# Patient Record
Sex: Female | Born: 1970 | Race: Black or African American | Hispanic: No | Marital: Single
Health system: Southern US, Community
[De-identification: ages and names within clinical notes are randomized; demographics above are authoritative.]

---

## 2003-04-21 ENCOUNTER — Emergency Department (HOSPITAL_COMMUNITY): Admission: AD | Admit: 2003-04-21 | Discharge: 2003-04-21 | Payer: Self-pay | Admitting: Family Medicine

## 2004-04-25 ENCOUNTER — Emergency Department (HOSPITAL_COMMUNITY): Admission: EM | Admit: 2004-04-25 | Discharge: 2004-04-25 | Payer: Self-pay | Admitting: Emergency Medicine

## 2004-05-26 ENCOUNTER — Emergency Department (HOSPITAL_COMMUNITY): Admission: EM | Admit: 2004-05-26 | Discharge: 2004-05-26 | Payer: Self-pay | Admitting: Emergency Medicine

## 2004-08-04 ENCOUNTER — Emergency Department (HOSPITAL_COMMUNITY): Admission: EM | Admit: 2004-08-04 | Discharge: 2004-08-04 | Payer: Self-pay | Admitting: Emergency Medicine

## 2004-08-04 ENCOUNTER — Emergency Department (HOSPITAL_COMMUNITY): Admission: EM | Admit: 2004-08-04 | Discharge: 2004-08-04 | Payer: Self-pay | Admitting: Family Medicine

## 2005-07-05 ENCOUNTER — Emergency Department (HOSPITAL_COMMUNITY): Admission: EM | Admit: 2005-07-05 | Discharge: 2005-07-05 | Payer: Self-pay | Admitting: Emergency Medicine

## 2006-08-08 IMAGING — CT CT ABDOMEN W/O CM
1 series · 15 of 32 positions shown, 19 images · non-contrast
Comparison: None.

CLINICAL DATA: Left flank pain and left lower quadrant abdominal pain since
earlier this morning.

CT ABDOMEN AND PELVIS WITHOUT CONTRAST (URINARY CALCULUS PROTOCOL)  05/26/2004:
TECHNIQUE: Neither intravenous nor oral contrast were administered.
Multidetector helical CT through the abdomen and pelvis was performed to include
the kidneys, ureters, and bladder.

[Series 2: renal stone · axial · 0.79mm/px · z∈[-400,-56]mm · 15 of 76 slices shown, 19 images]
[im 5/76  soft-tissue]
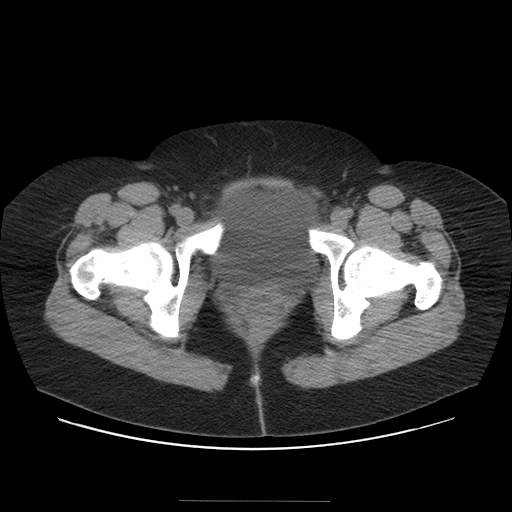
[im 5/76  bone]
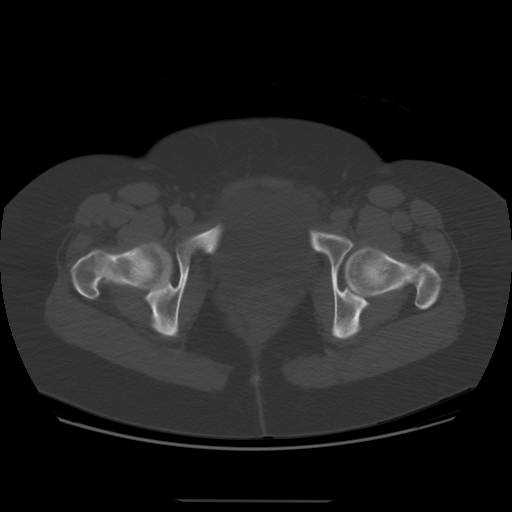
[im 10/76  soft-tissue]
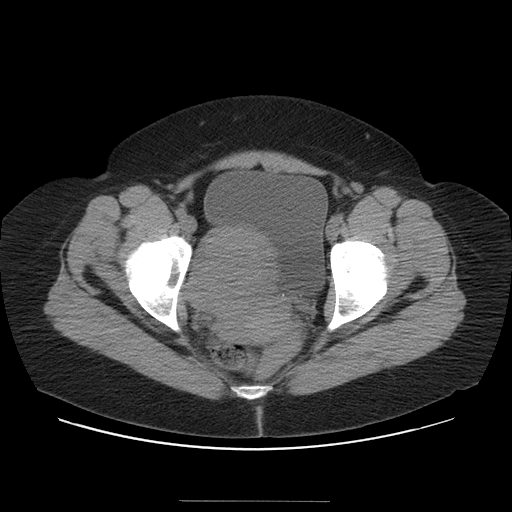
[im 15/76  soft-tissue]
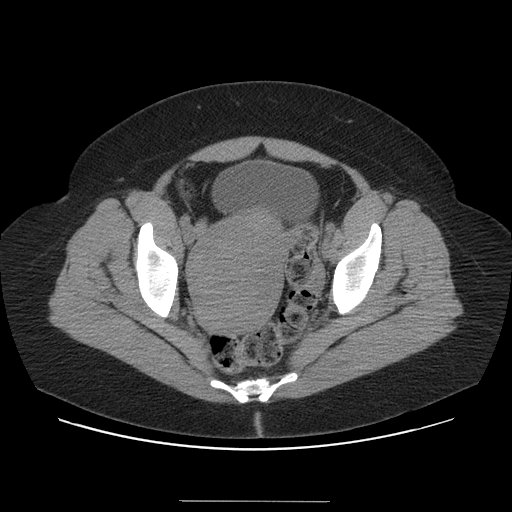
[im 22/76  soft-tissue]
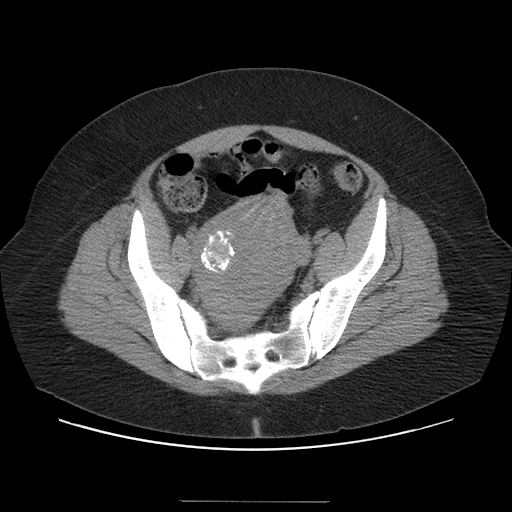
[im 27/76  soft-tissue]
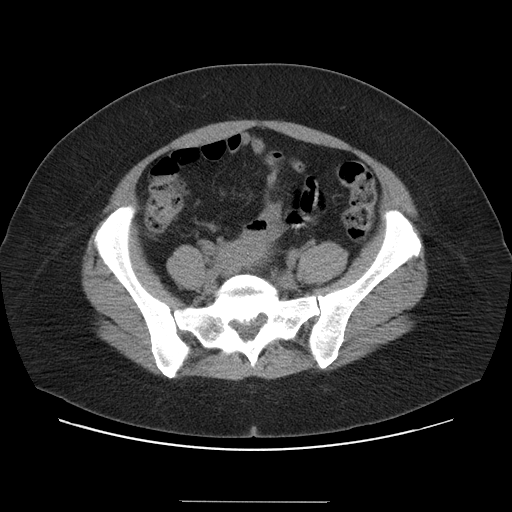
[im 32/76  soft-tissue]
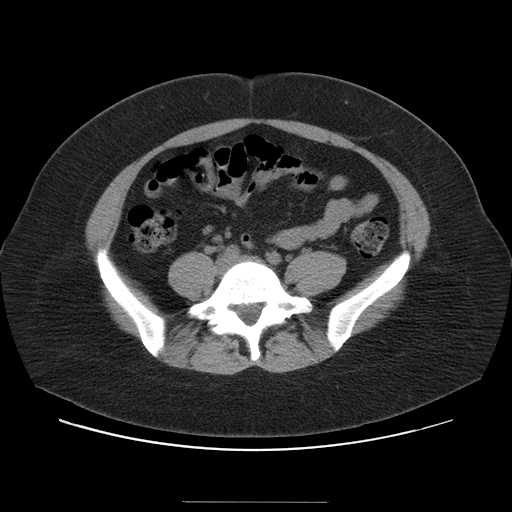
[im 39/76  soft-tissue]
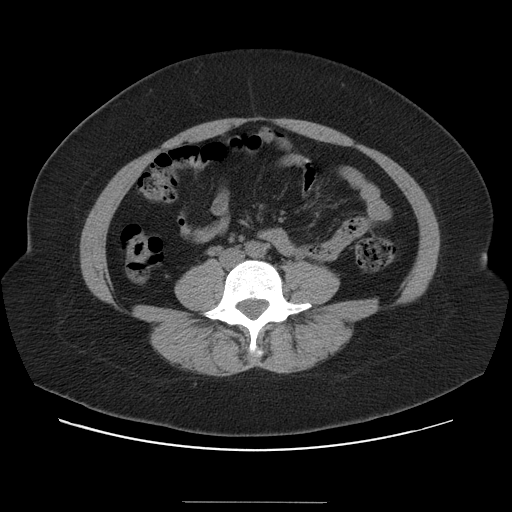
[im 44/76  soft-tissue]
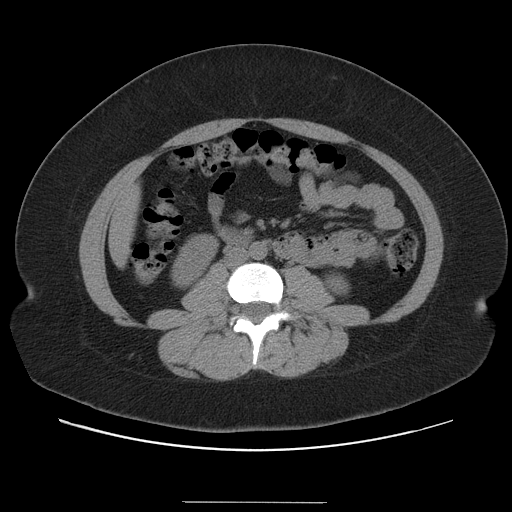
[im 49/76  soft-tissue]
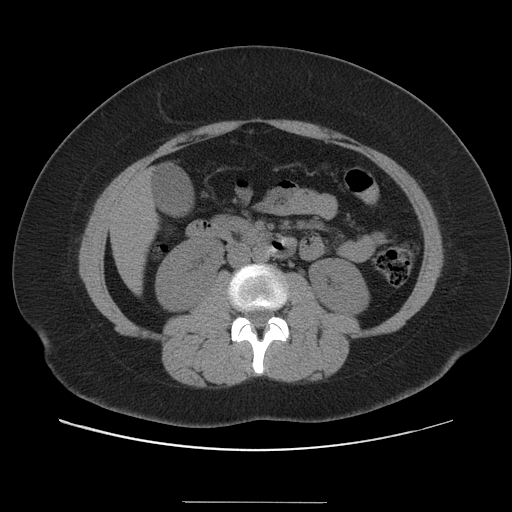
[im 49/76  bone]
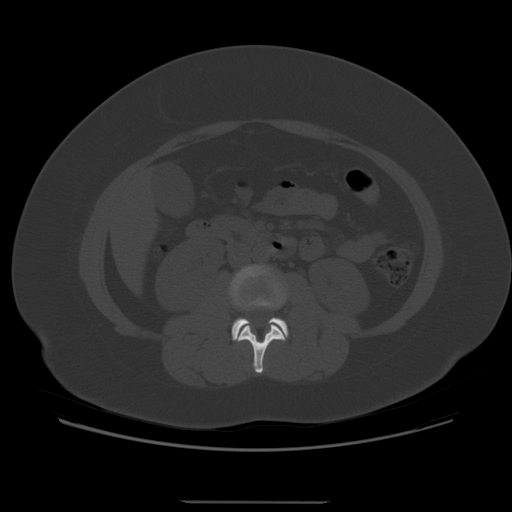
[im 54/76  soft-tissue]
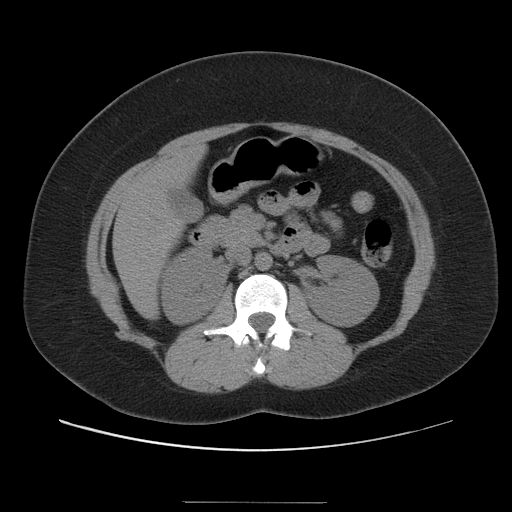
[im 61/76  soft-tissue]
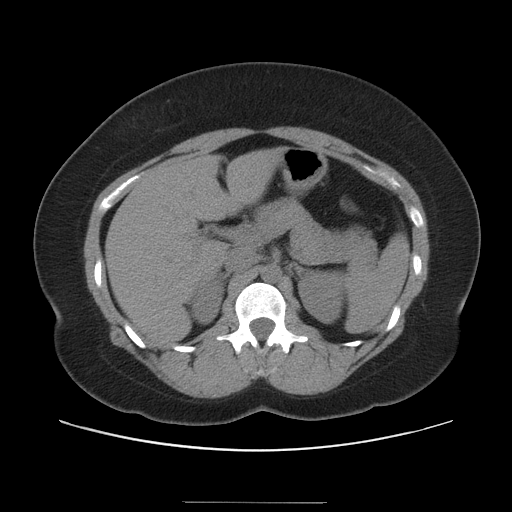
[im 66/76  soft-tissue]
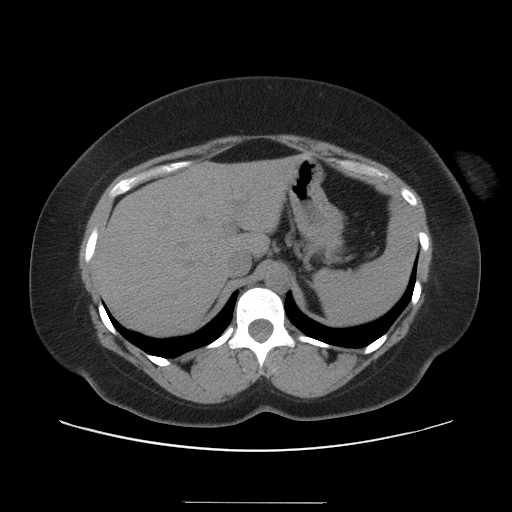
[im 66/76  lung]
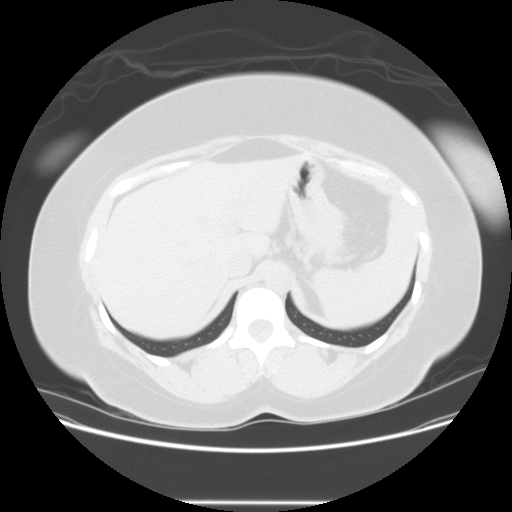
[im 68/76  lung]
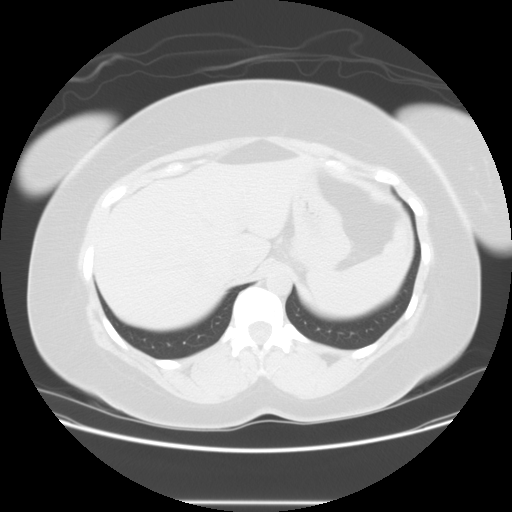
[im 71/76  soft-tissue]
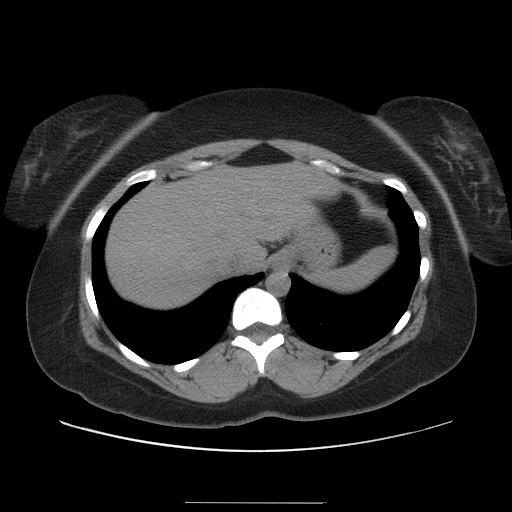
[im 71/76  lung]
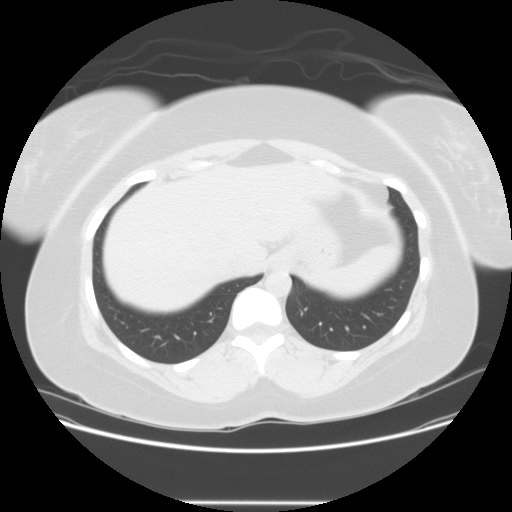
[im 73/76  lung]
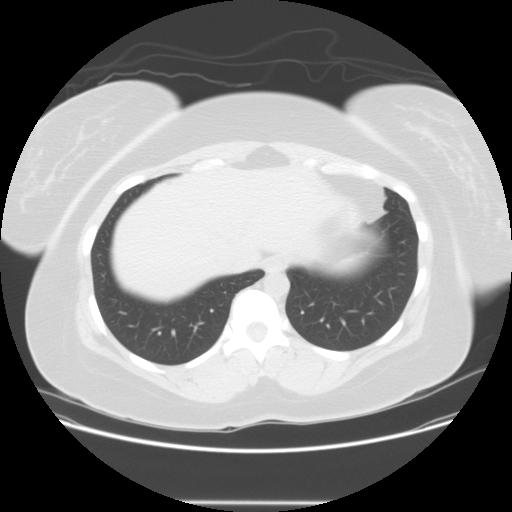

[15 of 32 positions shown; findings below may reference images not displayed]

CT ABDOMEN

No intrarenal or proximal ureteral calculi are identified on either side. There
is no evidence of hydronephrosis or other secondary signs of upper urinary tract
obstruction. Given the limitations of the unenhanced technique, the visualized
upper abdomen is unremarkable. The visualized lung bases appear clear.
IMPRESSION: Normal unenhanced CT of the visualized upper abdomen.

CT PELVIS

Numerous phleboliths are present in both sides of the pelvis. No distal ureteral
calculi are identified. The uterus is enlarged and contains numerous large
fibroids, one of which is calcified and degenerated. The ovaries are normal in
appearance. The urinary bladder is normal. Cecal diverticula are noted, though
there is no evidence of diverticulitis. I do not identify sigmoid
diverticulosis. The small bowel is unremarkable.
IMPRESSION: 1. No acute abnormalities in the pelvis.

2. Enlarged uterus containing multiple fibroids, one of which is calcified and
degenerated.

3. Cecal diverticula.

## 2007-01-24 ENCOUNTER — Emergency Department (HOSPITAL_COMMUNITY): Admission: EM | Admit: 2007-01-24 | Discharge: 2007-01-24 | Payer: Self-pay | Admitting: Emergency Medicine

## 2011-02-11 LAB — POCT URINALYSIS DIP (DEVICE)
Glucose, UA: NEGATIVE
Nitrite: NEGATIVE
Operator id: 247071
Protein, ur: NEGATIVE
Urobilinogen, UA: 1

## 2011-02-11 LAB — POCT PREGNANCY, URINE
Operator id: 247071
Preg Test, Ur: NEGATIVE

## 2022-01-28 ENCOUNTER — Other Ambulatory Visit: Payer: Self-pay | Admitting: Gastroenterology

## 2022-04-08 ENCOUNTER — Encounter (HOSPITAL_COMMUNITY): Payer: Self-pay | Admitting: Gastroenterology

## 2022-04-09 NOTE — Progress Notes (Signed)
Attempted to obtain medical history via telephone, unable to reach at this time. Unable to leave voicemail to return pre surgical testing department's phone call,due to mailbox not set up.   

## 2022-04-16 ENCOUNTER — Ambulatory Visit (HOSPITAL_COMMUNITY)
Admission: RE | Admit: 2022-04-16 | Payer: No Typology Code available for payment source | Source: Ambulatory Visit | Admitting: Gastroenterology

## 2022-04-16 ENCOUNTER — Encounter (HOSPITAL_COMMUNITY): Admission: RE | Payer: Self-pay | Source: Ambulatory Visit

## 2022-04-16 SURGERY — COLONOSCOPY WITH PROPOFOL
Anesthesia: Monitor Anesthesia Care

## 2022-06-24 ENCOUNTER — Other Ambulatory Visit: Payer: Self-pay | Admitting: Gastroenterology

## 2022-07-02 ENCOUNTER — Encounter (HOSPITAL_COMMUNITY): Payer: Self-pay | Admitting: Gastroenterology

## 2022-07-02 NOTE — Progress Notes (Signed)
Attempted to obtain medical history via telephone, unable to reach at this time. HIPAA compliant voicemail message left requesting return call to pre surgical testing department. 

## 2022-07-09 ENCOUNTER — Ambulatory Visit (HOSPITAL_COMMUNITY): Admit: 2022-07-09 | Payer: No Typology Code available for payment source | Admitting: Gastroenterology

## 2022-07-09 ENCOUNTER — Encounter (HOSPITAL_COMMUNITY): Payer: Self-pay

## 2022-07-09 SURGERY — COLONOSCOPY WITH PROPOFOL
Anesthesia: Monitor Anesthesia Care
